# Patient Record
Sex: Male | Born: 1970 | Race: White | Hispanic: No | Marital: Single | Smoking: Never smoker
Health system: Southern US, Community
[De-identification: ages and names within clinical notes are randomized; demographics above are authoritative.]

## PROBLEM LIST (undated history)

## (undated) DIAGNOSIS — J45909 Unspecified asthma, uncomplicated: Secondary | ICD-10-CM

## (undated) DIAGNOSIS — R21 Rash and other nonspecific skin eruption: Secondary | ICD-10-CM

## (undated) HISTORY — PX: FEMUR FRACTURE SURGERY: SHX633

---

## 2013-03-13 ENCOUNTER — Emergency Department (HOSPITAL_COMMUNITY): Payer: 59

## 2013-03-13 ENCOUNTER — Encounter (HOSPITAL_COMMUNITY): Payer: Self-pay | Admitting: Emergency Medicine

## 2013-03-13 ENCOUNTER — Emergency Department (HOSPITAL_COMMUNITY)
Admission: EM | Admit: 2013-03-13 | Discharge: 2013-03-14 | Disposition: A | Discharge status: Home / Self Care | Payer: 59 | Attending: Emergency Medicine | Admitting: Emergency Medicine

## 2013-03-13 DIAGNOSIS — J45901 Unspecified asthma with (acute) exacerbation: Secondary | ICD-10-CM

## 2013-03-13 HISTORY — DX: Unspecified asthma, uncomplicated: J45.909

## 2013-03-13 MED ORDER — AEROCHAMBER Z-STAT PLUS/MEDIUM MISC
1.0000 | Freq: Once | Status: AC
  Administered 2013-03-14: 1
  Filled 2013-03-13: qty 1

## 2013-03-13 MED ORDER — PREDNISONE 20 MG PO TABS: 60.0000 mg | ORAL_TABLET | Freq: Every day | ORAL | Status: DC

## 2013-03-13 MED ORDER — ALBUTEROL SULFATE (5 MG/ML) 0.5% IN NEBU
5.0000 mg | INHALATION_SOLUTION | Freq: Once | RESPIRATORY_TRACT | Status: AC
  Administered 2013-03-13: 5 mg via RESPIRATORY_TRACT
  Filled 2013-03-13: qty 1

## 2013-03-13 MED ORDER — ALBUTEROL SULFATE (5 MG/ML) 0.5% IN NEBU
INHALATION_SOLUTION | RESPIRATORY_TRACT | Status: AC
  Administered 2013-03-13: 5 mg via RESPIRATORY_TRACT
  Filled 2013-03-13: qty 1

## 2013-03-13 MED ORDER — IPRATROPIUM BROMIDE 0.02 % IN SOLN
0.5000 mg | RESPIRATORY_TRACT | Status: AC
  Administered 2013-03-13: 0.5 mg via RESPIRATORY_TRACT

## 2013-03-13 MED ORDER — ALBUTEROL SULFATE HFA 108 (90 BASE) MCG/ACT IN AERS: 1.0000 | INHALATION_SPRAY | Freq: Four times a day (QID) | RESPIRATORY_TRACT | Status: AC | PRN

## 2013-03-13 MED ORDER — ALBUTEROL SULFATE (5 MG/ML) 0.5% IN NEBU
5.0000 mg | INHALATION_SOLUTION | Freq: Once | RESPIRATORY_TRACT | Status: AC
  Administered 2013-03-13: 5 mg via RESPIRATORY_TRACT

## 2013-03-13 MED ORDER — ALBUTEROL (5 MG/ML) CONTINUOUS INHALATION SOLN
10.0000 mg/h | INHALATION_SOLUTION | RESPIRATORY_TRACT | Status: DC
  Administered 2013-03-13: 10 mg/h via RESPIRATORY_TRACT

## 2013-03-13 MED ORDER — PREDNISONE 20 MG PO TABS
60.0000 mg | ORAL_TABLET | Freq: Once | ORAL | Status: AC
  Administered 2013-03-13: 60 mg via ORAL
  Filled 2013-03-13: qty 3

## 2013-03-13 MED ORDER — ALBUTEROL (5 MG/ML) CONTINUOUS INHALATION SOLN
INHALATION_SOLUTION | RESPIRATORY_TRACT | Status: AC
  Administered 2013-03-13: 10 mg/h via RESPIRATORY_TRACT
  Filled 2013-03-13: qty 20

## 2013-03-13 MED ORDER — ALBUTEROL SULFATE HFA 108 (90 BASE) MCG/ACT IN AERS
2.0000 | INHALATION_SPRAY | RESPIRATORY_TRACT | Status: DC | PRN
  Administered 2013-03-14: 2 via RESPIRATORY_TRACT
  Filled 2013-03-13: qty 6.7

## 2013-03-13 MED ORDER — IPRATROPIUM BROMIDE 0.02 % IN SOLN
RESPIRATORY_TRACT | Status: AC
  Administered 2013-03-13: 0.5 mg via RESPIRATORY_TRACT
  Filled 2013-03-13: qty 2.5

## 2013-03-13 NOTE — ED Notes (Signed)
Pt. reports wheezing with occasional dry cough onset today , ran out of MDI .

## 2013-03-13 NOTE — ED Provider Notes (Signed)
CSN: [id number]     Arrival date & time 03/13/13  2222 History   First MD Initiated Contact with Patient 03/13/13 2308     Chief Complaint  Patient presents with  . Asthma   (Consider location/radiation/quality/duration/timing/severity/associated sxs/prior Treatment) HPI Hx per PT - wheezing and SOB worse over the last few days.  No productive cough.  Has h/o asthma, uncertain what his triggers are, has required admit in the remote past but has never required intubation. No F/C, symptoms MOD in severity.  Past Medical History  Diagnosis Date  . Asthma    History reviewed. No pertinent past surgical history. No family history on file. History  Substance Use Topics  . Smoking status: Never Smoker   . Smokeless tobacco: Not on file  . Alcohol Use: No    Review of Systems  Constitutional: Negative for fever and chills.  HENT: Negative for neck pain and neck stiffness.   Eyes: Negative for visual disturbance.  Respiratory: Positive for shortness of breath and wheezing.   Cardiovascular: Negative for chest pain.  Gastrointestinal: Negative for vomiting and abdominal pain.  Genitourinary: Negative for dysuria.  Musculoskeletal: Negative for back pain.  Skin: Negative for rash.  Neurological: Negative for headaches.  All other systems reviewed and are negative.    Allergies  Review of patient's allergies indicates no known allergies.  Home Medications   Current Outpatient Rx  Name  Route  Sig  Dispense  Refill  . OVER THE COUNTER MEDICATION   Oral   Take 1 continuous puffing by mouth every 3 (three) hours as needed (for shortness of breath). OTC nebulizer          BP 116/73  Pulse 68  Temp(Src) 97.3 F (36.3 C) (Oral)  Resp 16  SpO2 93% Physical Exam  Constitutional: He is oriented to person, place, and time. He appears well-developed and well-nourished.  HENT:  Head: Normocephalic and atraumatic.  Mouth/Throat: Oropharynx is clear and moist.  Eyes: EOM are  normal. Pupils are equal, round, and reactive to light.  Neck: Neck supple.  Cardiovascular: Normal rate, regular rhythm and intact distal pulses.   Pulmonary/Chest: No stridor. No respiratory distress. He exhibits no tenderness.  Exp wheezes bilateral with decreased breath sounds  Abdominal: Soft. There is no tenderness.  Musculoskeletal: Normal range of motion. He exhibits no edema and no tenderness.  Neurological: He is alert and oriented to person, place, and time.  Skin: Skin is warm and dry.    ED Course  Procedures (including critical care time) Labs Review Labs Reviewed - No data to display Imaging Review No results found.  Room Air Oxygen sat 95% is adequate  Albuterol nebs and steroids provided. Serial evaluations with improving condition.   11:17 PM feeling better after 3 treatments and steroids and wants to go home. Lung sounds clear, no longer wheezing.   Plan d/c home with RX albuterol and steroids x 5 days. Outpatient referrals and return precautions provided.   MDM  Dx: Acute Asthma Exacerbation  Improved with medications Serial evaluations VS and nurses notes reviewed     Sunnie Nielsen, MD 03/13/13 [phone]

## 2013-03-13 NOTE — ED Notes (Signed)
RT at bedside evaluating pt

## 2013-03-13 NOTE — ED Notes (Signed)
PA at bedside.

## 2013-03-13 NOTE — ED Notes (Signed)
Prednisone given, EDP at [facility], family at [facility], VSS, 1 hr CAT neb in progress, pt/wife deny questions, updated on plan by EDP.

## 2013-03-13 NOTE — ED Notes (Signed)
Neb in progress, family at [facility].

## 2013-03-14 MED ORDER — AZITHROMYCIN 250 MG PO TABS: ORAL_TABLET | ORAL | Status: DC

## 2013-07-04 ENCOUNTER — Encounter (HOSPITAL_COMMUNITY): Payer: Self-pay | Admitting: Emergency Medicine

## 2013-07-04 ENCOUNTER — Emergency Department (HOSPITAL_COMMUNITY): Payer: Managed Care, Other (non HMO)

## 2013-07-04 ENCOUNTER — Emergency Department (HOSPITAL_COMMUNITY)
Admission: EM | Admit: 2013-07-04 | Discharge: 2013-07-04 | Disposition: A | Discharge status: Home / Self Care | Payer: Managed Care, Other (non HMO) | Attending: Emergency Medicine | Admitting: Emergency Medicine

## 2013-07-04 DIAGNOSIS — F411 Generalized anxiety disorder: Secondary | ICD-10-CM | POA: Insufficient documentation

## 2013-07-04 DIAGNOSIS — L509 Urticaria, unspecified: Secondary | ICD-10-CM

## 2013-07-04 DIAGNOSIS — Z79899 Other long term (current) drug therapy: Secondary | ICD-10-CM | POA: Insufficient documentation

## 2013-07-04 DIAGNOSIS — L501 Idiopathic urticaria: Secondary | ICD-10-CM | POA: Insufficient documentation

## 2013-07-04 DIAGNOSIS — J45909 Unspecified asthma, uncomplicated: Secondary | ICD-10-CM | POA: Insufficient documentation

## 2013-07-04 DIAGNOSIS — IMO0002 Reserved for concepts with insufficient information to code with codable children: Secondary | ICD-10-CM | POA: Insufficient documentation

## 2013-07-04 LAB — COMPREHENSIVE METABOLIC PANEL
ALK PHOS: 43 U/L (ref 39–117)
ALT: 19 U/L (ref 0–53)
AST: 18 U/L (ref 0–37)
Albumin: 3.4 g/dL — ABNORMAL LOW (ref 3.5–5.2)
BUN: 22 mg/dL (ref 6–23)
CHLORIDE: 102 meq/L (ref 96–112)
CO2: 25 meq/L (ref 19–32)
Calcium: 8 mg/dL — ABNORMAL LOW (ref 8.4–10.5)
Creatinine, Ser: 0.95 mg/dL (ref 0.50–1.35)
GLUCOSE: 112 mg/dL — AB (ref 70–99)
Potassium: 4 mEq/L (ref 3.7–5.3)
SODIUM: 139 meq/L (ref 137–147)
Total Bilirubin: 0.6 mg/dL (ref 0.3–1.2)
Total Protein: 6.3 g/dL (ref 6.0–8.3)

## 2013-07-04 LAB — CBC
HEMATOCRIT: 45.1 % (ref 39.0–52.0)
Hemoglobin: 15.6 g/dL (ref 13.0–17.0)
MCH: 31.4 pg (ref 26.0–34.0)
MCHC: 34.6 g/dL (ref 30.0–36.0)
MCV: 90.7 fL (ref 78.0–100.0)
Platelets: 212 10*3/uL (ref 150–400)
RBC: 4.97 MIL/uL (ref 4.22–5.81)
RDW: 13.7 % (ref 11.5–15.5)
WBC: 9.5 10*3/uL (ref 4.0–10.5)

## 2013-07-04 LAB — POCT I-STAT TROPONIN I: Troponin i, poc: 0 ng/mL (ref 0.00–0.08)

## 2013-07-04 MED ORDER — PREDNISONE 20 MG PO TABS: ORAL_TABLET | ORAL | Status: DC

## 2013-07-04 MED ORDER — DIPHENHYDRAMINE HCL 25 MG PO CAPS: 25.0000 mg | ORAL_CAPSULE | Freq: Four times a day (QID) | ORAL | Status: DC | PRN

## 2013-07-04 MED ORDER — METHYLPREDNISOLONE SODIUM SUCC 125 MG IJ SOLR
125.0000 mg | Freq: Once | INTRAMUSCULAR | Status: AC
  Administered 2013-07-04: 125 mg via INTRAVENOUS
  Filled 2013-07-04: qty 2

## 2013-07-04 MED ORDER — DIPHENHYDRAMINE HCL 50 MG/ML IJ SOLN
25.0000 mg | Freq: Once | INTRAMUSCULAR | Status: AC
Start: 2013-07-04 — End: 2013-07-04
  Administered 2013-07-04: 25 mg via INTRAVENOUS
  Filled 2013-07-04: qty 1

## 2013-07-04 MED ORDER — FAMOTIDINE 20 MG PO TABS: 20.0000 mg | ORAL_TABLET | Freq: Two times a day (BID) | ORAL | Status: DC

## 2013-07-04 MED ORDER — DIPHENHYDRAMINE HCL 50 MG/ML IJ SOLN
25.0000 mg | Freq: Once | INTRAMUSCULAR | Status: AC
  Administered 2013-07-04: 25 mg via INTRAVENOUS
  Filled 2013-07-04: qty 1

## 2013-07-04 MED ORDER — FAMOTIDINE IN NACL 20-0.9 MG/50ML-% IV SOLN
20.0000 mg | Freq: Once | INTRAVENOUS | Status: AC
  Administered 2013-07-04: 20 mg via INTRAVENOUS
  Filled 2013-07-04: qty 50

## 2013-07-04 NOTE — ED Notes (Signed)
The pt is c/o itching all over that started today and has become more severe in the past 2 hours.  His ears are swollen and his hands are swollen.  He has had some chest  Pain that started  approx one hour ago.

## 2013-07-04 NOTE — ED Notes (Signed)
He took benadryl 100mg   And one visteril 2 hours ago with no improvement

## 2013-07-04 NOTE — ED Provider Notes (Signed)
CSN: [id number]     Arrival date & time 07/04/13  0117 History   First MD Initiated Contact with Patient 07/04/13 0200     Chief Complaint  Patient presents with  . Allergic Reaction   (Consider location/radiation/quality/duration/timing/severity/associated sxs/prior Treatment) HPI This patient is a 43 yo man who presents with complaints of diffuse urticaria and itching. Sx began shortly after he ate dinner. He reports rash over his face, neck, torso and arms. No tongue or lip swelling. No SOB. Patient notes history of similar sx x 1 in the remote past.   He has not started any new medications. He is taking hydroxyzine and ativan - both prn for anxiety. He has been taking these meds for quite a while.   Past Medical History  Diagnosis Date  . Asthma    History reviewed. No pertinent past surgical history. No family history on file. History  Substance Use Topics  . Smoking status: Never Smoker   . Smokeless tobacco: Not on file  . Alcohol Use: No    Review of Systems Ten point review of symptoms performed and is negative with the exception of symptoms noted above and mention of mild chest heaviness which began shortly after urticaria.   Allergies  Review of patient's allergies indicates no known allergies.  Home Medications   Current Outpatient Rx  Name  Route  Sig  Dispense  Refill  . albuterol (PROVENTIL HFA;VENTOLIN HFA) 108 (90 BASE) MCG/ACT inhaler   Inhalation   Inhale 1-2 puffs into the lungs every 6 (six) hours as needed for wheezing.   1 Inhaler   0   . betamethasone valerate (VALISONE) 0.1 % cream   Topical   Apply 1 application topically daily.         . fluticasone (FLONASE) 50 MCG/ACT nasal spray   Each Nare   Place 1 spray into both nostrils daily.         . hydrOXYzine (ATARAX/VISTARIL) 25 MG tablet   Oral   Take 25 mg by mouth at bedtime.         Marland Kitchen ibuprofen (ADVIL,MOTRIN) 200 MG tablet   Oral   Take 400 mg by mouth every 6 (six) hours as  needed for moderate pain.         Marland Kitchen LORazepam (ATIVAN) 1 MG tablet   Oral   Take 1 mg by mouth every 6 (six) hours as needed for anxiety.          BP 121/75  Pulse 79  Temp(Src) 98.6 F (37 C) (Oral)  Resp 18  SpO2 90% Physical Exam Gen: well developed and well nourished appearing Head: NCAT Eyes: PERL, EOMI Nose: no epistaixis or rhinorrhea Mouth/throat: mucosa is moist and pink Neck: supple, no stridor Lungs: CTA B, no wheezing, rhonchi or rales CV: RRR, no murmur, extremities appear well perfused.  Abd: soft, notender, nondistended Back: no ttp, no cva ttp Skin: the skin is raised with wheels which coalesce and are erythematous and blanching over the neck, torso and both arms.  Ext: normal to inspection, no dependent edema Neuro: CN ii-xii grossly intact, no focal deficits Psyche; normal affect,  calm and cooperative.   ED Course  Procedures (including critical care time) Labs Review  Results for orders placed during the hospital encounter of 07/04/13 (from the past 24 hour(s))  CBC     Status: None   Collection Time    07/04/13  4:30 AM      Result Value Range  WBC 9.5  4.0 - 10.5 K/uL   RBC 4.97  4.22 - 5.81 MIL/uL   Hemoglobin 15.6  13.0 - 17.0 g/dL   HCT 16.145.1  09.639.0 - 04.552.0 %   MCV 90.7  78.0 - 100.0 fL   MCH 31.4  26.0 - 34.0 pg   MCHC 34.6  30.0 - 36.0 g/dL   RDW 40.913.7  81.111.5 - 91.415.5 %   Platelets 212  150 - 400 K/uL  COMPREHENSIVE METABOLIC PANEL     Status: Abnormal   Collection Time    07/04/13  4:30 AM      Result Value Range   Sodium 139  137 - 147 mEq/L   Potassium 4.0  3.7 - 5.3 mEq/L   Chloride 102  96 - 112 mEq/L   CO2 25  19 - 32 mEq/L   Glucose, Bld 112 (*) 70 - 99 mg/dL   BUN 22  6 - 23 mg/dL   Creatinine, Ser 7.820.95  0.50 - 1.35 mg/dL   Calcium 8.0 (*) 8.4 - 10.5 mg/dL   Total Protein 6.3  6.0 - 8.3 g/dL   Albumin 3.4 (*) 3.5 - 5.2 g/dL   AST 18  0 - 37 U/L   ALT 19  0 - 53 U/L   Alkaline Phosphatase 43  39 - 117 U/L   Total  Bilirubin 0.6  0.3 - 1.2 mg/dL   GFR calc non Af Amer >90  >90 mL/min   GFR calc Af Amer >90  >90 mL/min  POCT I-STAT TROPONIN I     Status: None   Collection Time    07/04/13  4:47 AM      Result Value Range   Troponin i, poc 0.00  0.00 - 0.08 ng/mL   Comment 3              CXR: normal cardiac silloute, normal appearing mediastinum, no infiltrates, no acute process identified.    EKG: nsr, no acute ischemic changes, normal intervals, normal axis, normal qrs complex  MDM   Patient with idiopathic urticaria. Better after tx with benadryl, pepcid and solumedrol. STable for d/c.    Brandt LoosenJulie Rocio Roam, MD 07/04/13 [phone]

## 2013-11-05 ENCOUNTER — Emergency Department (HOSPITAL_COMMUNITY)
Admission: EM | Admit: 2013-11-05 | Discharge: 2013-11-05 | Disposition: A | Discharge status: Home / Self Care | Payer: Managed Care, Other (non HMO) | Attending: Emergency Medicine | Admitting: Emergency Medicine

## 2013-11-05 ENCOUNTER — Encounter (HOSPITAL_COMMUNITY): Payer: Self-pay | Admitting: Emergency Medicine

## 2013-11-05 DIAGNOSIS — J45909 Unspecified asthma, uncomplicated: Secondary | ICD-10-CM | POA: Insufficient documentation

## 2013-11-05 DIAGNOSIS — IMO0002 Reserved for concepts with insufficient information to code with codable children: Secondary | ICD-10-CM | POA: Insufficient documentation

## 2013-11-05 DIAGNOSIS — L209 Atopic dermatitis, unspecified: Secondary | ICD-10-CM

## 2013-11-05 DIAGNOSIS — R21 Rash and other nonspecific skin eruption: Secondary | ICD-10-CM

## 2013-11-05 DIAGNOSIS — Z79899 Other long term (current) drug therapy: Secondary | ICD-10-CM | POA: Insufficient documentation

## 2013-11-05 DIAGNOSIS — L2089 Other atopic dermatitis: Secondary | ICD-10-CM | POA: Insufficient documentation

## 2013-11-05 HISTORY — DX: Rash and other nonspecific skin eruption: R21

## 2013-11-05 MED ORDER — PREDNISONE 20 MG PO TABS: ORAL_TABLET | ORAL | Status: DC

## 2013-11-05 NOTE — ED Notes (Signed)
Pt to ED with c/o generalized rash, reports chronic allergies. Pt states he has had allergies for long time but worse for last 3 days. Pt states no medication help except prednisone. Generalized rash noted.

## 2013-11-05 NOTE — Discharge Instructions (Signed)
Please follow up closely with a dermatologist for further evaluation of your rash.  This may be atopic dermatitis, take prednisone as prescribed.  Use benadryl as needed for itch.  Rash A rash is a change in the color or texture of your skin. There are many different types of rashes. You may have other problems that accompany your rash. CAUSES   Infections.  Allergic reactions. This can include allergies to pets or foods.  Certain medicines.  Exposure to certain chemicals, soaps, or cosmetics.  Heat.  Exposure to poisonous plants.  Tumors, both cancerous and noncancerous. SYMPTOMS   Redness.  Scaly skin.  Itchy skin.  Dry or cracked skin.  Bumps.  Blisters.  Pain. DIAGNOSIS  Your caregiver may do a physical exam to determine what type of rash you have. A skin sample (biopsy) may be taken and examined under a microscope. TREATMENT  Treatment depends on the type of rash you have. Your caregiver may prescribe certain medicines. For serious conditions, you may need to see a skin doctor (dermatologist). HOME CARE INSTRUCTIONS   Avoid the substance that caused your rash.  Do not scratch your rash. This can cause infection.  You may take cool baths to help stop itching.  Only take over-the-counter or prescription medicines as directed by your caregiver.  Keep all follow-up appointments as directed by your caregiver. SEEK IMMEDIATE MEDICAL CARE IF:  You have increasing pain, swelling, or redness.  You have a fever.  You have new or severe symptoms.  You have body aches, diarrhea, or vomiting.  Your rash is not better after 3 days. MAKE SURE YOU:  Understand these instructions.  Will watch your condition.  Will get help right away if you are not doing well or get worse. Document Released: 05/23/2002 Document Revised: 08/25/2011 Document Reviewed: 03/17/2011 [facility] Patient Information 2014 [place], [place].

## 2013-11-05 NOTE — ED Provider Notes (Signed)
Medical screening examination/treatment/procedure(s) were performed by non-physician practitioner and as supervising physician I was immediately available for consultation/collaboration.  Megan E Docherty, MD 11/05/13 2345 

## 2013-11-05 NOTE — ED Provider Notes (Signed)
CSN: [id number]     Arrival date & time 11/05/13  1505 History  This chart was scribed for Eric HelperBowie Abayomi Pattison, PA-C, working with [name], MD by [name], ED Scribe. This patient was seen in room TR09C/TR09C and the patient's care was started at 3:36 PM.    Chief Complaint  Patient presents with  . Rash      HPI  HPI Comments: Eric PriestRobert Dawson is a 43 y.o. male who presents to the Emergency Department complaining of a constant, itching, generalized rash that reappeared about four days ago. He states that the rash has been recurrent for about two years, with flare ups lasting for several weeks. He states that the only medication that seems to help is a 10 day taper pack of Prednisone. He has been putting lotion on the current rash without relief. He denies fever, vomiting or difficulty breathing. 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 He states he has a history of uncontrolled asthma. He uses Pulmicort in the morning and evenings for his asthma. He denies ever being hospitalized or intubated for his asthma.   Past Medical History  Diagnosis Date  . Asthma   . Rash    History reviewed. No pertinent past surgical history. History reviewed. No pertinent family history. History  Substance Use Topics  . Smoking status: Never Smoker   . Smokeless tobacco: Not on file  . Alcohol Use: Yes     Comment: occ    Review of Systems  Constitutional: Negative for fever and chills.  Respiratory: Negative for shortness of breath.   Gastrointestinal: Negative for vomiting.  Skin: Positive for rash.      Allergies  Lexapro  Home Medications   Prior to Admission medications   Medication Sig Start Date End Date Taking? Authorizing Provider  albuterol (PROVENTIL HFA;VENTOLIN HFA) 108 (90 BASE) MCG/ACT inhaler Inhale 1-2 puffs into the lungs every 6 (six) hours as needed for wheezing. 03/13/13  Yes Sunnie NielsenBrian Opitz, MD  budesonide  (PULMICORT) 180 MCG/ACT inhaler Inhale 1 puff into the lungs 2 (two) times daily.   Yes Historical Provider, MD  diphenhydrAMINE (BENADRYL) 25 mg capsule Take 25-100 mg by mouth every 6 (six) hours as needed for allergies or sleep.   Yes Historical Provider, MD  fluticasone (FLONASE) 50 MCG/ACT nasal spray Place 1 spray into both nostrils daily.   Yes Historical Provider, MD  hydrOXYzine (VISTARIL) 50 MG capsule Take 100-200 mg by mouth at bedtime as needed for itching.   Yes Historical Provider, MD  ibuprofen (ADVIL,MOTRIN) 200 MG tablet Take 800 mg by mouth every 6 (six) hours as needed for moderate pain.    Yes Historical Provider, MD  diphenhydrAMINE (BENADRYL) 25 mg capsule Take 1 capsule (25 mg total) by mouth every 6 (six) hours as needed. 07/04/13   Brandt LoosenJulie Manly, MD   Triage Vitals: 3BP 128/83  Pulse 87  Temp(Src) 97.4 F (36.3 C) (Oral)  Resp 18  Ht 6\' 1"  (1.854 m)  Wt 245 lb (111.131 kg)  BMI 32.33 kg/m2  SpO2 98%  Physical Exam  Nursing note and vitals reviewed. Constitutional: He is oriented to person, place, and time. He appears well-developed and well-nourished. No distress.  HENT:  Head: Normocephalic and atraumatic.  Eyes: EOM are normal.  Neck: Neck supple. No tracheal deviation present.  Cardiovascular: Normal rate.   Pulmonary/Chest: Effort normal. No respiratory distress.  Musculoskeletal: Normal range of motion.  Neurological: He  is alert and oriented to person, place, and time.  Skin: Skin is warm and dry. Rash noted.  Fine, scaly like skin irritation noted throughout upper back, upper chest, periumbilical region, bilateral forearm, bilateral leg, bilateral thigh and most significantly bilateral calf. No sign of obvious infection, petechiae, pustules or vesicles. No rash on palms of hands or soles of feet.   Psychiatric: He has a normal mood and affect. His behavior is normal.    ED Course  Procedures (including critical care time)  DIAGNOSTIC STUDIES: Oxygen  Saturation is 98% on room air, normal by my interpretation.    COORDINATION OF CARE: 3:39 PM -Will prescribe Prednisone taper pack for 10 days. Recommend follow up with specialist. Patient verbalizes understanding and agrees with treatment plan.   Labs Review Labs Reviewed - No data to display  Imaging Review No results found.   EKG Interpretation None      MDM   Final diagnoses:  Rash  Atopic dermatitis and related condition    BP 128/83  Pulse 87  Temp(Src) 97.4 F (36.3 C) (Oral)  Resp 18  Ht 6\' 1"  (1.854 m)  Wt 245 lb (111.131 kg)  BMI 32.33 kg/m2  SpO2 98%   I personally performed the services described in this documentation, which was scribed in my presence. The recorded information has been reviewed and is accurate.     Eric Helper, PA-C 11/05/13 1615

## 2014-03-02 ENCOUNTER — Encounter (HOSPITAL_COMMUNITY): Payer: Self-pay | Admitting: Emergency Medicine

## 2014-03-02 ENCOUNTER — Emergency Department (HOSPITAL_COMMUNITY)
Admission: EM | Admit: 2014-03-02 | Discharge: 2014-03-02 | Disposition: A | Discharge status: Home / Self Care | Payer: 59 | Attending: Emergency Medicine | Admitting: Emergency Medicine

## 2014-03-02 DIAGNOSIS — IMO0002 Reserved for concepts with insufficient information to code with codable children: Secondary | ICD-10-CM | POA: Diagnosis not present

## 2014-03-02 DIAGNOSIS — J45901 Unspecified asthma with (acute) exacerbation: Secondary | ICD-10-CM | POA: Diagnosis present

## 2014-03-02 DIAGNOSIS — Z79899 Other long term (current) drug therapy: Secondary | ICD-10-CM | POA: Insufficient documentation

## 2014-03-02 LAB — I-STAT TROPONIN, ED: Troponin i, poc: 0 ng/mL (ref 0.00–0.08)

## 2014-03-02 MED ORDER — IPRATROPIUM BROMIDE 0.02 % IN SOLN
0.5000 mg | Freq: Once | RESPIRATORY_TRACT | Status: AC
  Administered 2014-03-02: 0.5 mg via RESPIRATORY_TRACT
  Filled 2014-03-02: qty 2.5

## 2014-03-02 MED ORDER — PREDNISONE 20 MG PO TABS: ORAL_TABLET | ORAL | Status: AC

## 2014-03-02 MED ORDER — IPRATROPIUM-ALBUTEROL 0.5-2.5 (3) MG/3ML IN SOLN
3.0000 mL | Freq: Once | RESPIRATORY_TRACT | Status: DC
  Filled 2014-03-02: qty 3

## 2014-03-02 MED ORDER — ALBUTEROL SULFATE HFA 108 (90 BASE) MCG/ACT IN AERS: 2.0000 | INHALATION_SPRAY | RESPIRATORY_TRACT | Status: AC | PRN

## 2014-03-02 MED ORDER — PREDNISONE 20 MG PO TABS
60.0000 mg | ORAL_TABLET | Freq: Once | ORAL | Status: AC
  Administered 2014-03-02: 60 mg via ORAL
  Filled 2014-03-02: qty 3

## 2014-03-02 MED ORDER — ALBUTEROL (5 MG/ML) CONTINUOUS INHALATION SOLN
10.0000 mg/h | INHALATION_SOLUTION | RESPIRATORY_TRACT | Status: DC
  Administered 2014-03-02: 10 mg/h via RESPIRATORY_TRACT
  Filled 2014-03-02: qty 20

## 2014-03-02 NOTE — Discharge Instructions (Signed)

## 2014-03-02 NOTE — ED Provider Notes (Signed)
CSN: [id number]     Arrival date & time 03/02/14  2039 History   First MD Initiated Contact with Patient 03/02/14 2110     Chief Complaint  Patient presents with  . Asthma     (Consider location/radiation/quality/duration/timing/severity/associated sxs/prior Treatment) HPI 43 year old male history of asthma ran out of his inhaler previously in the last few days sister having nonproductive cough with wheezing and shortness of breath constant gradually worsening over the last few days with no fever no confusion no other concerns.  Diffuse wheezing pulse ox normal room-air 99% speaks short sentences  Past Medical History  Diagnosis Date  . Asthma   . Rash    Past Surgical History  Procedure Laterality Date  . Femur fracture surgery     Family History  Problem Relation Age of Onset  . Asthma Brother   . Asthma Other    History  Substance Use Topics  . Smoking status: Never Smoker   . Smokeless tobacco: Not on file  . Alcohol Use: Yes     Comment: occ    Review of Systems 10 Systems reviewed and are negative for acute change except as noted in the HPI.   Allergies  Lexapro  Home Medications   Prior to Admission medications   Medication Sig Start Date End Date Taking? Authorizing Provider  albuterol (PROVENTIL HFA;VENTOLIN HFA) 108 (90 BASE) MCG/ACT inhaler Inhale 1-2 puffs into the lungs every 6 (six) hours as needed for wheezing. 03/13/13  Yes Sunnie Nielsen, MD  budesonide (PULMICORT) 180 MCG/ACT inhaler Inhale 1 puff into the lungs 2 (two) times daily.   Yes Historical Provider, MD  predniSONE (DELTASONE) 20 MG tablet Take 20 mg by mouth every other day.   Yes Historical Provider, MD  albuterol (PROVENTIL HFA;VENTOLIN HFA) 108 (90 BASE) MCG/ACT inhaler Inhale 2 puffs into the lungs every 2 (two) hours as needed for wheezing or shortness of breath (cough). 03/02/14   Hurman Horn, MD  predniSONE (DELTASONE) 20 MG tablet 2 tabs po daily x 4 days 03/02/14   Hurman Horn, MD    BP 120/69  Pulse 92  Temp(Src) 97.7 F (36.5 C) (Oral)  Resp 16  SpO2 94% Physical Exam  Nursing note and vitals reviewed. Constitutional:  Awake, alert, nontoxic appearance.  HENT:  Head: Atraumatic.  Eyes: Right eye exhibits no discharge. Left eye exhibits no discharge.  Neck: Neck supple.  Cardiovascular: Normal rate and regular rhythm.   No murmur heard. Pulmonary/Chest: He is in respiratory distress. He has wheezes. He has no rales. He exhibits no tenderness.  Diffuse wheezing pulse ox normal room-air 99% speaks short sentences  Abdominal: Soft. There is no tenderness. There is no rebound.  Musculoskeletal: He exhibits no tenderness.  Baseline ROM, no obvious new focal weakness.  Neurological:  Mental status and motor strength appears baseline for patient and situation.  Skin: No rash noted.  Psychiatric: He has a normal mood and affect.    ED Course  Procedures (including critical care time) Lungs clear patient speaks full sentences easily after nebulizer.Patient / Family / Caregiver informed of clinical course, understand medical decision-making process, and agree with plan. Labs Review Labs Reviewed  Rosezena Sensor, ED    Imaging Review No results found. No results found.  EKG Interpretation   Date/Time:  Thursday March 02 2014 20:48:49 EDT Ventricular Rate:  85 PR Interval:    QRS Duration: 109 QT Interval:  378 QTC Calculation: 449 R Axis:   82 Text Interpretation:  Artifact Normal sinus rhythm Baseline wander in  lead(s) V6 No significant change since last tracing Confirmed by [facility]   MD, Jonny Ruiz ([id number]) on 03/02/2014 9:26:15 PM      MDM   Final diagnoses:  Asthma attack    I doubt any other EMC precluding discharge at this time including, but not necessarily limited to the following:AMI, sepsis.    Hurman Horn, MD 03/15/14 [phone]

## 2014-03-02 NOTE — Progress Notes (Signed)
  CARE MANAGEMENT ED NOTE 03/02/2014  Patient:  BEARL, TALARICO   Account Number:  [id number]  Date Initiated:  03/02/2014  Documentation initiated by:  Radford Pax  Subjective/Objective Assessment:   Patient presents to ED with asthma attack.     Subjective/Objective Assessment Detail:   Patient with pmhx of asthma.  Patient has been without his inhaler for two days.     Action/Plan:   Action/Plan Detail:   Anticipated DC Date:       Status Recommendation to Physician:   Result of Recommendation:    Other ED Services  Consult Working Plan    DC Planning Services  Other  PCP issues    Choice offered to / List presented to:            Status of service:  Completed, signed off  ED Comments:   ED Comments Detail:  EDCM spoke to patient at bedside.  Patient confirms his pcp is Dr. Cheri Guppy in [place].  System updated.

## 2014-03-02 NOTE — ED Notes (Signed)
Pt's significant other states pt is having an asthma attack  Pt has been without his inhaler for about 2 days  Pt has audible wheezing noted in triage  Pt states he is having the sensation that something is sitting on his chest and has tightness

## 2014-09-17 IMAGING — CR DG CHEST 2V
2 series · 2 of 2 positions shown · non-contrast
Comparison: Chest radiograph performed 03/13/2013

CLINICAL DATA: Centralized chest pain and cough. History of asthma.

EXAM:
CHEST  2 VIEW

[w chest pa]
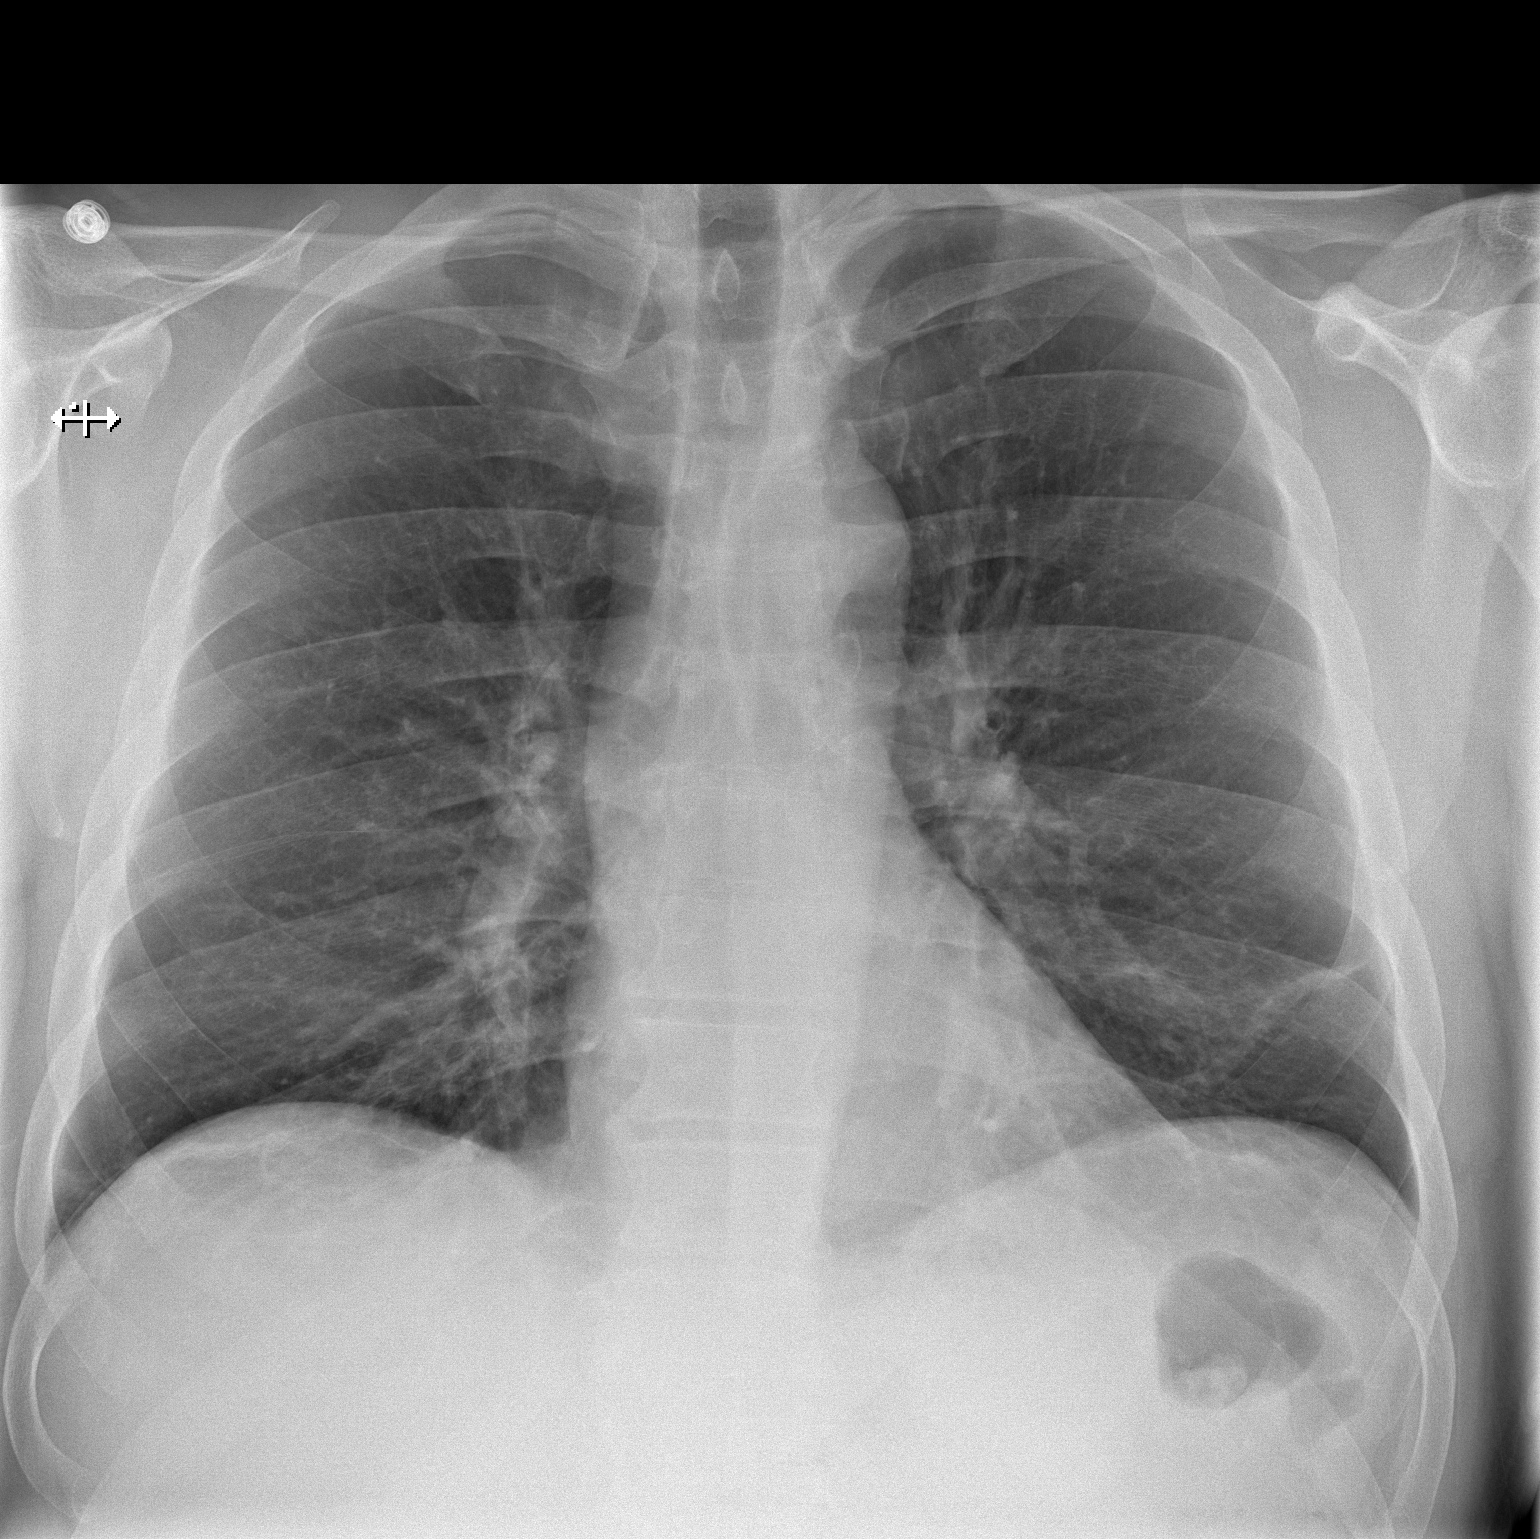

[w chest lat]
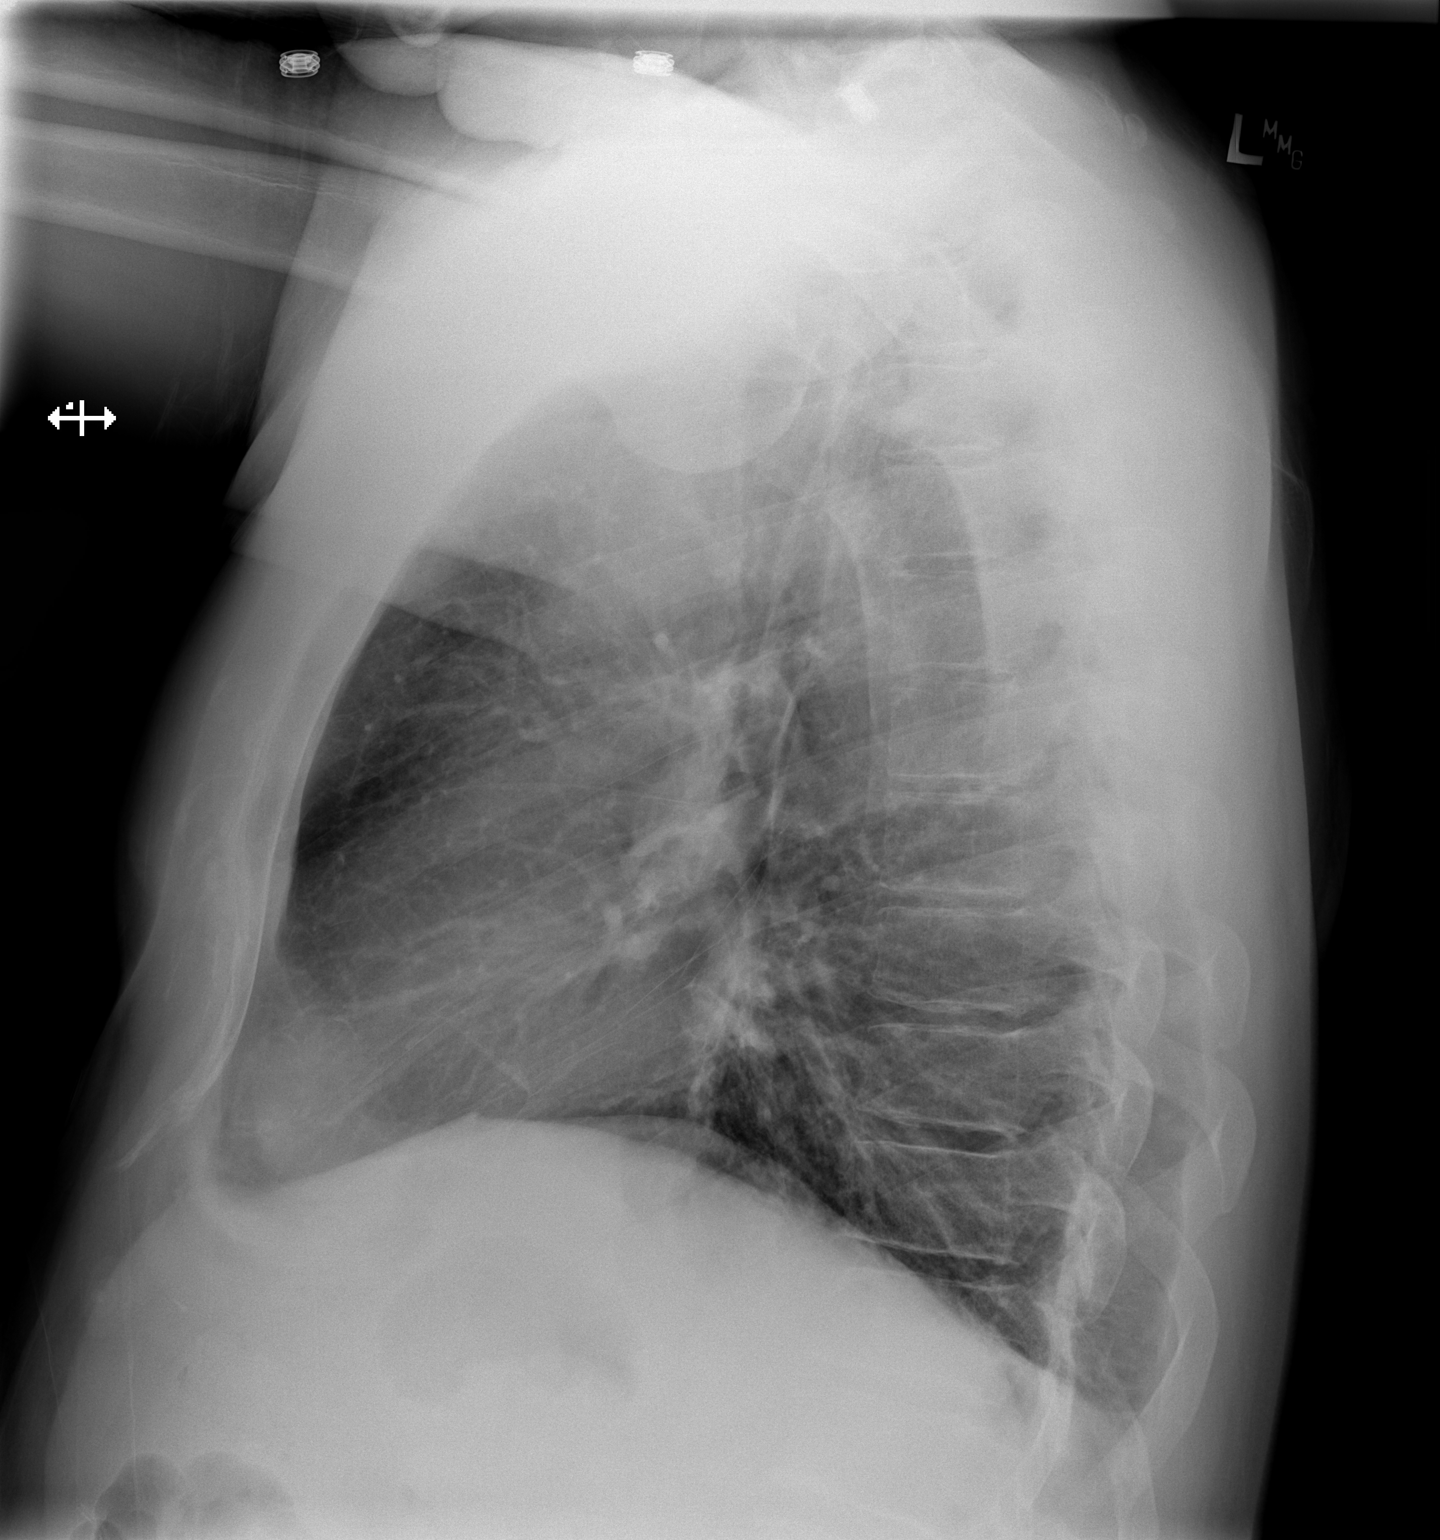

[2 of 2 positions shown; findings below may reference images not displayed]

FINDINGS: The lungs are well-aerated. Mild left basilar atelectasis is noted.
There is no evidence of pleural effusion or pneumothorax.

The heart is normal in size; the mediastinal contour is within
normal limits. No acute osseous abnormalities are seen.
IMPRESSION: Mild left basilar atelectasis noted; lungs otherwise clear.
